# Patient Record
Sex: Male | Born: 1980 | Race: Black or African American | Hispanic: No | Marital: Single | Smoking: Current every day smoker
Health system: Southern US, Community
[De-identification: ages and names within clinical notes are randomized; demographics above are authoritative.]

## PROBLEM LIST (undated history)

## (undated) HISTORY — PX: OTHER SURGICAL HISTORY: SHX169

---

## 2018-03-27 ENCOUNTER — Other Ambulatory Visit: Payer: Self-pay

## 2018-03-27 ENCOUNTER — Encounter (HOSPITAL_BASED_OUTPATIENT_CLINIC_OR_DEPARTMENT_OTHER): Payer: Self-pay

## 2018-03-27 ENCOUNTER — Emergency Department (HOSPITAL_BASED_OUTPATIENT_CLINIC_OR_DEPARTMENT_OTHER)
Admission: EM | Admit: 2018-03-27 | Discharge: 2018-03-27 | Disposition: A | Discharge status: Home / Self Care | Payer: Self-pay | Attending: Emergency Medicine | Admitting: Emergency Medicine

## 2018-03-27 ENCOUNTER — Emergency Department (HOSPITAL_BASED_OUTPATIENT_CLINIC_OR_DEPARTMENT_OTHER): Payer: Self-pay

## 2018-03-27 DIAGNOSIS — M79645 Pain in left finger(s): Secondary | ICD-10-CM | POA: Insufficient documentation

## 2018-03-27 DIAGNOSIS — F17200 Nicotine dependence, unspecified, uncomplicated: Secondary | ICD-10-CM | POA: Insufficient documentation

## 2018-03-27 MED ORDER — CEPHALEXIN 250 MG PO CAPS
500.0000 mg | ORAL_CAPSULE | Freq: Once | ORAL | Status: AC
  Administered 2018-03-27: 500 mg via ORAL
  Filled 2018-03-27: qty 2

## 2018-03-27 MED ORDER — HYDROCODONE-ACETAMINOPHEN 5-325 MG PO TABS
2.0000 | ORAL_TABLET | Freq: Once | ORAL | Status: AC
  Administered 2018-03-27: 2 via ORAL
  Filled 2018-03-27: qty 2

## 2018-03-27 MED ORDER — CEPHALEXIN 500 MG PO CAPS: 500.0000 mg | ORAL_CAPSULE | Freq: Four times a day (QID) | ORAL | 0 refills | Status: AC

## 2018-03-27 MED ORDER — HYDROCODONE-ACETAMINOPHEN 5-325 MG PO TABS: 2.0000 | ORAL_TABLET | ORAL | 0 refills | Status: AC | PRN

## 2018-03-27 NOTE — Discharge Instructions (Signed)
Your evaluated in the emergency department for right thumb pain.  You had an x-ray that did not show an obvious fracture.  We have concerns that there may be an infection in that area and are prescribing you antibiotics.  If this seems to be getting worse it is possible that you may need that area drained.  We are giving you the number for a hand surgeon if you have worsening symptoms.

## 2018-03-27 NOTE — ED Provider Notes (Signed)
[facility] EMERGENCY DEPARTMENT Provider Note   CSN: [id number] Arrival date & time: 03/27/18  1847     History   Chief Complaint Chief Complaint  Patient presents with  . Hand Pain    HPI Oscar Byrd is a 37 y.o. male.  He is right-hand dominant.  He works as a Glass blower/designerpacker.  He is complaining of left thumb pain that is been going on for 5 days.  He denies any trauma but he says he always is being in his hands.  The pain is primarily at the distal part of the thumb at the pad but he states it radiates throughout the thumb into the hand and up to the wrist.  He states it severe when there is any kind of pressure on the pad.  He is denied any fever or other illness symptoms.  He has been taking ibuprofen with minimal relief.  He was unable to work today secondary to the pain.  The history is provided by the patient.  Hand Pain  This is a new problem. The current episode started more than 2 days ago. The problem occurs constantly. The problem has not changed since onset.Pertinent negatives include no chest pain, no abdominal pain, no headaches and no shortness of breath. Exacerbated by: pressure. The symptoms are relieved by rest. He has tried rest for the symptoms. The treatment provided no relief.    History reviewed. No pertinent past medical history.  There are no active problems to display for this patient.   Past Surgical History:  Procedure Laterality Date  . arm surgery          Home Medications    Prior to Admission medications   Not on File    Family History No family history on file.  Social History Social History   Tobacco Use  . Smoking status: Current Every Day Smoker  . Smokeless tobacco: Never Used  Substance Use Topics  . Alcohol use: Yes    Comment: daily  . Drug use: Yes    Types: Marijuana     Allergies   Patient has no known allergies.   Review of Systems Review of Systems  Constitutional: Negative for fever.  HENT: Negative  for sore throat.   Eyes: Negative for visual disturbance.  Respiratory: Negative for shortness of breath.   Cardiovascular: Negative for chest pain.  Gastrointestinal: Negative for abdominal pain.  Genitourinary: Negative for dysuria.  Musculoskeletal: Negative for back pain and neck pain.  Skin: Negative for rash.  Neurological: Negative for headaches.     Physical Exam Updated Vital Signs BP (!) 182/122 (BP Location: Left Arm)   Pulse 70   Temp 98.1 F (36.7 C) (Oral)   Resp 18   Ht 5\' 11"  (1.803 m)   Wt 89.8 kg (198 lb)   SpO2 100%   BMI 27.62 kg/m   Physical Exam  Constitutional: He appears well-developed and well-nourished.  HENT:  Head: Normocephalic and atraumatic.  Eyes: Conjunctivae are normal.  Neck: Neck supple.  Pulmonary/Chest: Effort normal.  Musculoskeletal: Normal range of motion.  Patient's left shoulder elbow and wrist are full range of motion without difficulty.  There is no particular pain in any of his other digits except for his thumb.  His MCP and IP are nontender.  His pad of left thumb is tender and slightly swollen.  There is no fluctuance to it and he has a lot of callus over his thumb.  The nail is intact and there  is normal cap refill.  He has intact sensation.  Neurological: He is alert. He has normal strength. No sensory deficit. GCS eye subscore is 4. GCS verbal subscore is 5. GCS motor subscore is 6.  Skin: Skin is warm and dry. Capillary refill takes less than 2 seconds.  Psychiatric: He has a normal mood and affect.  Nursing note and vitals reviewed.    ED Treatments / Results  Labs (all labs ordered are listed, but only abnormal results are displayed) Labs Reviewed - No data to display  EKG None  Radiology Dg Finger Thumb Left  Result Date: 03/27/2018 CLINICAL DATA:  Left thumb pain. EXAM: LEFT THUMB 2+V COMPARISON:  None. FINDINGS: No fracture. No subluxation or dislocation. Degenerative changes are noted in the MCP joint.  IMPRESSION: Degenerative changes without acute bony abnormality. Electronically Signed   By: Kennith Center M.D.   On: 03/27/2018 19:51    Procedures Procedures (including critical care time)  Medications Ordered in ED Medications  HYDROcodone-acetaminophen (NORCO/VICODIN) 5-325 MG per tablet 2 tablet (has no administration in time range)  cephALEXin (KEFLEX) capsule 500 mg (has no administration in time range)     Initial Impression / Assessment and Plan / ED Course  I have reviewed the triage vital signs and the nursing notes.  Pertinent labs & imaging results that were available during my care of the patient were reviewed by me and considered in my medical decision making (see chart for details).  Clinical Course as of Mar 28 1712  Wed Mar 27, 2018  7043 37 year old right-hand-dominant male with left thumb pain.  He does not have a known history of trauma.  He seems most tender in his distal phalanx and primarily on the pad.  Checking an x-ray to make sure it is not a fracture but I am concerned this may be a felon.  It does not look swollen enough that it needs to be I&D currently in if the x-rays are negative we will put him on some antibiotics with the understanding that he may need to follow-up with Hand consultant   [MB]  [id number] Reviewed patient in the PMP.   [MB]    Clinical Course User Index [MB] Terrilee Files, MD     Final Clinical Impressions(s) / ED Diagnoses   Final diagnoses:  Pain of left thumb    ED Discharge Orders        Ordered    cephALEXin (KEFLEX) 500 MG capsule  4 times daily     03/27/18 1917    HYDROcodone-acetaminophen (NORCO/VICODIN) 5-325 MG tablet  Every 4 hours PRN     03/27/18 1917       Terrilee Files, MD 03/28/18 1714

## 2018-03-27 NOTE — ED Notes (Signed)
Pt verbalizes understanding of d/c instructions and denies any further needs at this time. 

## 2018-03-27 NOTE — ED Triage Notes (Signed)
Pt c/o pain to left hand x 5 days-no known injury-NAD-steady gait

## 2018-04-02 ENCOUNTER — Encounter (HOSPITAL_BASED_OUTPATIENT_CLINIC_OR_DEPARTMENT_OTHER): Payer: Self-pay

## 2018-04-02 ENCOUNTER — Emergency Department (HOSPITAL_BASED_OUTPATIENT_CLINIC_OR_DEPARTMENT_OTHER)
Admission: EM | Admit: 2018-04-02 | Discharge: 2018-04-02 | Disposition: A | Discharge status: Home / Self Care | Payer: Self-pay | Attending: Emergency Medicine | Admitting: Emergency Medicine

## 2018-04-02 ENCOUNTER — Emergency Department (HOSPITAL_BASED_OUTPATIENT_CLINIC_OR_DEPARTMENT_OTHER): Payer: Self-pay

## 2018-04-02 ENCOUNTER — Other Ambulatory Visit: Payer: Self-pay

## 2018-04-02 DIAGNOSIS — L03012 Cellulitis of left finger: Secondary | ICD-10-CM | POA: Insufficient documentation

## 2018-04-02 DIAGNOSIS — F17200 Nicotine dependence, unspecified, uncomplicated: Secondary | ICD-10-CM | POA: Insufficient documentation

## 2018-04-02 MED ORDER — LIDOCAINE HCL (PF) 1 % IJ SOLN
5.0000 mL | Freq: Once | INTRAMUSCULAR | Status: AC
  Administered 2018-04-02: 5 mL

## 2018-04-02 MED ORDER — DOXYCYCLINE HYCLATE 100 MG PO CAPS: 100.0000 mg | ORAL_CAPSULE | Freq: Two times a day (BID) | ORAL | 0 refills | Status: AC

## 2018-04-02 NOTE — ED Provider Notes (Signed)
[facility] EMERGENCY DEPARTMENT Provider Note   CSN: [id number] Arrival date & time: 04/02/18  1839     History   Chief Complaint Chief Complaint  Patient presents with  . Hand Pain    HPI Oscar Byrd is a 36 y.o. male presenting for 2 weeks of left thumb pain.  Patient describes pain as sharp, worse with palpation and 7/10 in severity.  Patient was seen here in the emergency department 1 week ago, at that time there was no sign of abscess to drain and patient was placed on Keflex and given return precautions.  Patient states that his pain has worsened since discharge and that his thumb has continued to swell, he states pain now radiates up his left hand to his left wrist.  Patient denies trauma or wound to the thumb.  He denies history of chronic diseases.  He states he is otherwise healthy.  Patient is right-hand dominant.  HPI  History reviewed. No pertinent past medical history.  There are no active problems to display for this patient.   Past Surgical History:  Procedure Laterality Date  . arm surgery          Home Medications    Prior to Admission medications   Medication Sig Start Date End Date Taking? Authorizing Provider  cephALEXin (KEFLEX) 500 MG capsule Take 1 capsule (500 mg total) by mouth 4 (four) times daily. 03/27/18   Terrilee Files, MD  doxycycline (VIBRAMYCIN) 100 MG capsule Take 1 capsule (100 mg total) by mouth 2 (two) times daily for 7 days. 04/02/18 04/09/18  Harlene Salts A, PA-C  HYDROcodone-acetaminophen (NORCO/VICODIN) 5-325 MG tablet Take 2 tablets by mouth every 4 (four) hours as needed for severe pain. 03/27/18   Terrilee Files, MD    Family History No family history on file.  Social History Social History   Tobacco Use  . Smoking status: Current Every Day Smoker  . Smokeless tobacco: Never Used  Substance Use Topics  . Alcohol use: Yes    Comment: daily  . Drug use: Yes    Types: Marijuana     Allergies     Patient has no known allergies.   Review of Systems Review of Systems  Constitutional: Negative.  Negative for chills and fever.  HENT: Negative.  Negative for rhinorrhea and sore throat.   Eyes: Negative.  Negative for visual disturbance.  Respiratory: Negative.  Negative for cough and shortness of breath.   Cardiovascular: Negative.  Negative for chest pain.  Gastrointestinal: Negative.  Negative for abdominal pain, blood in stool, diarrhea, nausea and vomiting.  Genitourinary: Negative.  Negative for dysuria and hematuria.  Musculoskeletal: Positive for arthralgias. Negative for myalgias.  Skin: Positive for color change. Negative for rash.  Neurological: Negative.  Negative for dizziness, weakness, numbness and headaches.     Physical Exam Updated Vital Signs BP 134/87 (BP Location: Left Arm)   Pulse 76   Temp 98.2 F (36.8 C) (Oral)   Resp 16   Ht 6' (1.829 m)   Wt 85.7 kg (189 lb)   SpO2 99%   BMI 25.63 kg/m   Physical Exam  Constitutional: He is oriented to person, place, and time. He appears well-developed and well-nourished. No distress.  HENT:  Head: Normocephalic and atraumatic.  Right Ear: External ear normal.  Left Ear: External ear normal.  Nose: Nose normal.  Eyes: Pupils are equal, round, and reactive to light. EOM are normal.  Neck: Trachea normal and normal range  of motion. No tracheal deviation present.  Pulmonary/Chest: Effort normal. No respiratory distress.  Abdominal: Soft. There is no tenderness. There is no rebound and no guarding.  Musculoskeletal: Normal range of motion.       Left hand: He exhibits tenderness and swelling. Normal sensation noted. Normal strength noted. He exhibits no thumb/finger opposition.       Hands: Neurological: He is alert and oriented to person, place, and time. No sensory deficit.  Skin: Skin is warm and dry.  Psychiatric: He has a normal mood and affect. His behavior is normal.     ED Treatments / Results   Labs (all labs ordered are listed, but only abnormal results are displayed) Labs Reviewed - No data to display  EKG None  Radiology Dg Finger Thumb Left  Result Date: 04/02/2018 CLINICAL DATA:  Acute left thumb pain without known injury. EXAM: LEFT THUMB 2+V COMPARISON:  Radiographs of March 27, 2018. FINDINGS: There is no evidence of fracture or dislocation. Possible degenerative changes seen involving the first metacarpophalangeal joint. Soft tissues are unremarkable IMPRESSION: Possible degenerative changes seen involving first metacarpophalangeal joint. No acute abnormality seen in the left thumb. Electronically Signed   By: Lupita Raider, M.D.   On: 04/02/2018 19:39    Procedures .Marland KitchenIncision and Drainage Date/Time: 04/02/2018 8:54 PM Performed by: Bill Salinas, PA-C Authorized by: Bill Salinas, PA-C   Consent:    Consent obtained:  Verbal   Consent given by:  Patient   Risks discussed:  Bleeding, incomplete drainage, pain, infection and damage to other organs Location:    Type:  Abscess   Size:  1cm x 1cm   Location:  Upper extremity   Upper extremity location:  Finger   Finger location:  L thumb Pre-procedure details:    Skin preparation:  Betadine Anesthesia (see MAR for exact dosages):    Anesthesia method:  Nerve block   Block needle gauge:  27 G   Block anesthetic:  Lidocaine 1% w/o epi   Block technique:  Digital   Block injection procedure:  Anatomic landmarks identified, introduced needle, incremental injection, negative aspiration for blood and anatomic landmarks palpated   Block outcome:  Anesthesia achieved Procedure type:    Complexity:  Simple Procedure details:    Incision types:  Single straight   Incision depth:  Subcutaneous   Scalpel blade:  11   Wound management:  Probed and deloculated   Drainage:  Purulent and bloody   Drainage amount:  Moderate   Wound treatment:  Wound left open   Packing materials:  None Post-procedure details:     Patient tolerance of procedure:  Tolerated well, no immediate complications Comments:     Incision approximately 1 cm long to left thumb pad.  Covered with sterile gauze and antibiotic ointment by nursing staff.   (including critical care time)  Medications Ordered in ED Medications  lidocaine (PF) (XYLOCAINE) 1 % injection 5 mL (5 mLs Infiltration Given by Other 04/02/18 2023)     Initial Impression / Assessment and Plan / ED Course  I have reviewed the triage vital signs and the nursing notes.  Pertinent labs & imaging results that were available during my care of the patient were reviewed by me and considered in my medical decision making (see chart for details).  Clinical Course as of Apr 02 2336  Tue Apr 02, 2018  2216 I have spoken to hand Ortho, Dr. Merlyn Lot, who recommends switching the patient to doxycycline and following  up with his office for an appointment this week.   [BM]    Clinical Course User Index [BM] Bill SalinasMorelli, Devonta Blanford A, PA-C   Patient presenting with left thumb pain and swelling for 2 weeks.  On physical examination there is an obvious felon to the left thumb.  This was incised and drained as documented above in the procedure note.  I have spoken to hand orthopedic surgeon Dr. Merlyn LotKuzma who recommends switching the patient to doxycycline for MRSA coverage.  Dr. Merlyn LotKuzma also recommends a follow-up with his office this week.  After procedure patient is resting comfortably in room states that he is no longer in pain after the procedure.  Wound dressed by nursing staff.  Patient prescribed doxycycline 100 mg twice daily for 7 days and given referral to Dr. Merrilee SeashoreKuzma's office.  At this time there does not appear to be any evidence of an acute emergency medical condition and the patient appears stable for discharge with appropriate outpatient follow up. Diagnosis was discussed with patient who verbalizes understanding of care plan and is agreeable to discharge. I have discussed return  precautions with patient and family who verbalize understanding of return precautions. Patient strongly encouraged to follow-up with their PCP. All questions answered.  Patient's case discussed with Dr. Silverio LayYao who agrees with plan to discharge with follow-up.     Note: Portions of this report may have been transcribed using voice recognition software. Every effort was made to ensure accuracy; however, inadvertent computerized transcription errors may still be present.   Final Clinical Impressions(s) / ED Diagnoses   Final diagnoses:  Felon of finger of left hand    ED Discharge Orders        Ordered    doxycycline (VIBRAMYCIN) 100 MG capsule  2 times daily     04/02/18 2224       Elizabeth PalauMorelli, Dhrithi Riche A, PA-C 04/02/18 2339    Charlynne PanderYao, David Hsienta, MD 04/03/18 [phone]

## 2018-04-02 NOTE — ED Notes (Signed)
Called Hand surgery for consult (Dr. Merlyn LotKuzma).

## 2018-04-02 NOTE — Discharge Instructions (Addendum)
Please take the new antibiotic medication, doxycycline, as prescribed. Please call the hand surgeon Dr. Merrilee Seashore office first thing tomorrow morning to schedule a follow-up appointment. Please also follow-up with your primary care provider regarding your visit today.  I have attached resources below if you need help finding a primary care provider.  Your blood pressure was elevated today, please be sure to follow-up with your primary doctor regarding this as well. Return to the emergency department for any new or worsening symptoms.  If for any reason you are unable to follow-up with Dr. Merrilee Seashore office in the next 2 days please return to the emergency department for wound check.  Contact a health care provider if: Your pain medicine is not helping. You have more redness, swelling, or pain at your fingertip. You continue to have fluid, blood, or pus coming from your fingertip. Your infection area feels warm to the touch. You continue to notice a bad smell coming from your fingertip or your dressing. Get help right away if: The area of redness is spreading, or you notice a red streak going away from your fingertip. You have a fever.  RESOURCE GUIDE  Chronic Pain Problems: Contact [facility]  [phone] Patients need to be referred by their primary care doctor.  Insufficient Money for Medicine: Contact United Way:  call "211" or [facility] [phone].  No Primary Care Doctor: Call [facility] Connect  [phone] - can help you locate a primary care doctor that  accepts your insurance, provides certain services, etc. Physician Referral Service- [phone]  Agencies that provide inexpensive medical care: [facility]  [phone] [facility]  [phone] [facility]  [phone] [facility]  [phone] Planned Parenthood  [phone] [facility]  [phone]  Medicaid-accepting [facility] Providers: [facility]- [address]  [phone], Mon-Fri 9am-7pm, Sat 9am-1pm [facility]- [address] [place], [address] [place]  [phone] [facility]- [address], [address] [place]  [phone] [facility]- [address]  [phone] Renaye Rakers- [address] [place], [address], [phone]  Only accepts [place] patients after they have their name  applied to their card  Self Pay (no insurance) in [facility]: Sickle Cell Patients: Dr Willey Blade, [facility] Internal Medicine  [address] [place], [phone] [facility]- [address] [place]  [phone]       [facility] [place]- [address]       -     [facility]- see information above (Speak to [employer] if you do not have insurance)       -  [facility]- [address] [place], [phone]       -  [facility]- [address],  [phone]       -  [facility]- [address], [phone]       -  Dr Julio Sicks-  [address], [address], [place], [phone]       -  [facility]- [address] [phone]       -  [facility]- [address], [phone], also [address], [phone]       -    [facility]- [address] [place], [phone], 1st & 3rd  Saturday   every month, 10am-1pm  1) Find a Doctor and Pay Out of Pocket Although you won't have to find out who is covered by your insurance plan, it is a good idea to ask around and get recommendations. You will then need to call the office and see if the doctor you have chosen will accept you as a new patient and what types of options they offer for patients who are self-pay. Some doctors offer discounts or will set up payment plans for their patients who do not have insurance, but you will need to ask so you aren't surprised when you get to your appointment.  2) Contact Your Local Health  Department Not all health departments have doctors that can see patients for sick visits, but many do, so it is worth a call to see if yours does. If you don't know where your local health department is, you can check in your phone book. The CDC also has a tool to help you locate your state's health department, and many state websites also have listings of all of their local health departments.  3) Find a Walk-in Clinic If your illness is not likely to be very severe or complicated, you may want to try a walk in clinic. These are popping up all over the country in pharmacies, drugstores, and shopping centers. They're usually staffed by nurse practitioners or physician assistants that have been trained to treat common illnesses and complaints. They're usually fairly quick and inexpensive. However, if you have serious medical issues or chronic medical problems, these are probably not your best option  STD Testing [facility] of [facility] [place], STD Clinic, [address], [place], phone [phone] or [phone].  Monday - Friday, call for an appointment. [facility] of [facility] [place], STD Clinic, [place], [place], phone [phone] or [phone].  Monday - Friday, call for an appointment.  Abuse/Neglect: [facility] Child Abuse Hotline 786-145-5638(336) (770) 197-7928 [facility] Child Abuse Hotline 843-637-42792177161927 (After Hours)  Emergency Shelter:  [facility] 551-537-0691(336) (787) [phone]  Maternity Homes: Room at the [place] [facility] [phone](336) [phone] [facility] [phone]  MRSA Hotline #:   938 343 4754204 398 3345  [facility] Resources  [facility]  United Way [facility]. [address].                 [address]         [address]  [place]                                               [place]                              [place] Phone:  [phone]                                   Phone:  [phone]                   Phone:  [phone]  [facility], [phone] [facility] - [facility]- [phone]       -     [facility] [place], [address],                                  [phone], Insurance  [place] Child Abuse Hotline (772)564-9832 or 737-374-7446 (After Hours)   Behavioral Health Services  Substance Abuse Resources: [facility] and Drug Services  (418)500-1003 [facility] (409)[phone] [facility] [place] [phone] Floydene Flock [phone] Residential & Outpatient Substance Abuse Program  [phone]  Psychological Services: [facility]  [phone] [facility] Services  [phone] [facility], [phone] [place]. [address], [place], ACCESS LINE: (629) 199-1659 or (815)212-3077, Ent[employer]ur[employer]an.co.za  Dental Assistance  If unable to pay or uninsured, contact:  [facility] or [facility]. to become qualified for the adult dental clinic.  Patients with Medicaid: [facility] [phone] [address], [phone] [address], [phone]  If unable to pay, or uninsured, contact [facility] [phone]) or [facility] Department [phone] in [place], [phone] in [facility]) to become qualified for the adult dental clinic   Other Low-Cost Community Dental Services: [facility]- [address] [place], [place], [place], [address], [phone], Ext. [phone], 2nd and 4th Thursday of the month at 6:30am.  10 clients each day by appointment, can sometimes see walk-in patients if someone does not show for an appointment. [facility]- [address] [place], [place], [address], [phone] [facility] [address] [place], [place], [address], [phone] [facility] Health Department- [phone] [facility] Health  Department- [phone] [facility] [phone]

## 2018-04-02 NOTE — ED Notes (Signed)
Patient transported to X-ray 

## 2018-04-02 NOTE — ED Notes (Signed)
Called Hand Surgeon (Dr. Merlyn LotKuzma).

## 2018-04-02 NOTE — ED Triage Notes (Signed)
Pt states he was advised to return prn for pain to left thumb-NAD-steady gait

## 2018-05-21 ENCOUNTER — Emergency Department (HOSPITAL_BASED_OUTPATIENT_CLINIC_OR_DEPARTMENT_OTHER)
Admission: EM | Admit: 2018-05-21 | Discharge: 2018-05-21 | Disposition: A | Discharge status: Home / Self Care | Payer: Self-pay | Attending: Emergency Medicine | Admitting: Emergency Medicine

## 2018-05-21 ENCOUNTER — Encounter (HOSPITAL_BASED_OUTPATIENT_CLINIC_OR_DEPARTMENT_OTHER): Payer: Self-pay

## 2018-05-21 ENCOUNTER — Other Ambulatory Visit: Payer: Self-pay

## 2018-05-21 DIAGNOSIS — F17210 Nicotine dependence, cigarettes, uncomplicated: Secondary | ICD-10-CM | POA: Insufficient documentation

## 2018-05-21 DIAGNOSIS — Y939 Activity, unspecified: Secondary | ICD-10-CM | POA: Insufficient documentation

## 2018-05-21 DIAGNOSIS — T23261A Burn of second degree of back of right hand, initial encounter: Secondary | ICD-10-CM | POA: Insufficient documentation

## 2018-05-21 DIAGNOSIS — Y990 Civilian activity done for income or pay: Secondary | ICD-10-CM | POA: Insufficient documentation

## 2018-05-21 DIAGNOSIS — Y929 Unspecified place or not applicable: Secondary | ICD-10-CM | POA: Insufficient documentation

## 2018-05-21 DIAGNOSIS — T23209A Burn of second degree of unspecified hand, unspecified site, initial encounter: Secondary | ICD-10-CM

## 2018-05-21 DIAGNOSIS — X16XXXA Contact with hot heating appliances, radiators and pipes, initial encounter: Secondary | ICD-10-CM | POA: Insufficient documentation

## 2018-05-21 NOTE — Discharge Instructions (Addendum)
Apply a thin layer of antibiotic ointment, such as bacitracin or neosporin daily and cover with a clean bandage.  Keep your burn clean with soap and water daily. You can apply a cool wash cloth for pain relief. Take 600mg  ibuprofen/advil ever 6 hours for pain. Return to the ER for signs of infection, including pus draining from wound, worsening pain.

## 2018-05-21 NOTE — ED Provider Notes (Signed)
[facility] EMERGENCY DEPARTMENT Provider Note   CSN: [id number] Arrival date & time: 05/21/18  2103     History   Chief Complaint Chief Complaint  Patient presents with  . Hand Burn    HPI Oscar Byrd is a 37 y.o. male presenting to the ED with complaint of burn to right hand that occurred yesterday while at work. He states he was burned by hot steam from a radiator. He has had some pain, worse with palpation. Reports blisters to right hand on thumb. No medications tried PTA. 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  The history is provided by the patient.    History reviewed. No pertinent past medical history.  There are no active problems to display for this patient.   Past Surgical History:  Procedure Laterality Date  . arm surgery          Home Medications    Prior to Admission medications   Medication Sig Start Date End Date Taking? Authorizing Provider  cephALEXin (KEFLEX) 500 MG capsule Take 1 capsule (500 mg total) by mouth 4 (four) times daily. 03/27/18   Terrilee Files, MD  HYDROcodone-acetaminophen (NORCO/VICODIN) 5-325 MG tablet Take 2 tablets by mouth every 4 (four) hours as needed for severe pain. 03/27/18   Terrilee Files, MD    Family History No family history on file.  Social History Social History   Tobacco Use  . Smoking status: Current Every Day Smoker  . Smokeless tobacco: Never Used  Substance Use Topics  . Alcohol use: Yes    Comment: daily  . Drug use: Yes    Types: Marijuana     Allergies   Patient has no known allergies.   Review of Systems Review of Systems  Skin: Positive for color change and wound.  All other systems reviewed and are negative.    Physical Exam Updated Vital Signs BP (!) 144/90 (BP Location: Left Arm)   Pulse 99   Temp 98.1 F (36.7 C) (Oral)   Resp 18   Ht 6' (1.829 m)   Wt 85.7 kg   SpO2 100%   BMI 25.63 kg/m   Physical Exam  Constitutional: He appears  well-developed and well-nourished. No distress.  HENT:  Head: Normocephalic and atraumatic.  Eyes: Conjunctivae are normal.  Cardiovascular: Normal rate and intact distal pulses.  Pulmonary/Chest: Effort normal.  Skin:  Right hand with blisters and mild erythema present. Blister are intact and blanchable. 1 blister is nickel-sized and located on dorsal hand bw 1st and 2nd metacarpals. The other is on dorsal hand at base of thumb. There are multiple very small blisters over interphalangeal joint.   Psychiatric: He has a normal mood and affect. His behavior is normal.  Nursing note and vitals reviewed.    ED Treatments / Results  Labs (all labs ordered are listed, but only abnormal results are displayed) Labs Reviewed - No data to display  EKG None  Radiology No results found.  Procedures Procedures (including critical care time)  Medications Ordered in ED Medications - No data to display   Initial Impression / Assessment and Plan / ED Course  I have reviewed the triage vital signs and the nursing notes.  Pertinent labs & imaging results that were available during my care of the patient were reviewed by me and considered in my medical decision making (see chart for details).     Pt with superficial partial thickness burn to a small portion of the dorsum  of his right hand. NV intact. No signs of infection. No hx of immunocompromise. Discussed proper burn care and symptomatic management. Work note provided. Safe for discharge.  Final Clinical Impressions(s) / ED Diagnoses   Final diagnoses:  Superficial partial thickness burn of hand    ED Discharge Orders    None       Robinson, [place] N, PA-C 05/21/18 2309    Arby BarrettePfeiffer, Marcy, MD 05/22/18 2147

## 2018-05-21 NOTE — ED Triage Notes (Signed)
Two blisters from hot steam from a radiator yesterday, blisters are intact, on right thumb and index finger

## 2019-04-18 IMAGING — DX DG FINGER THUMB 2+V*L*
3 series · 3 of 3 positions shown · non-contrast
Comparison: None.

CLINICAL DATA: Left thumb pain.

EXAM:
LEFT THUMB 2+V

[finger ap]
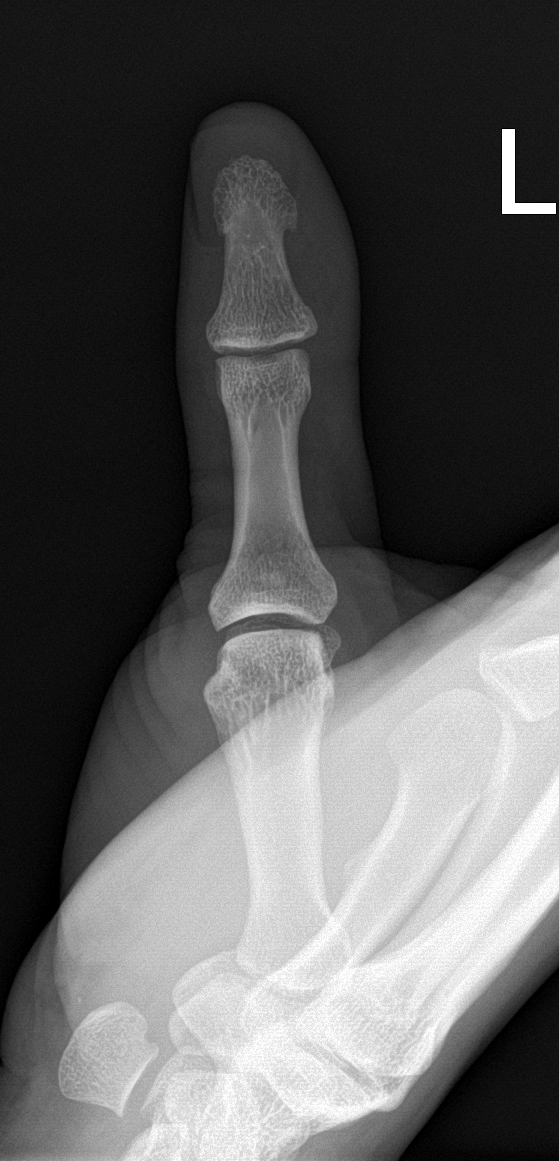

[finger lat]
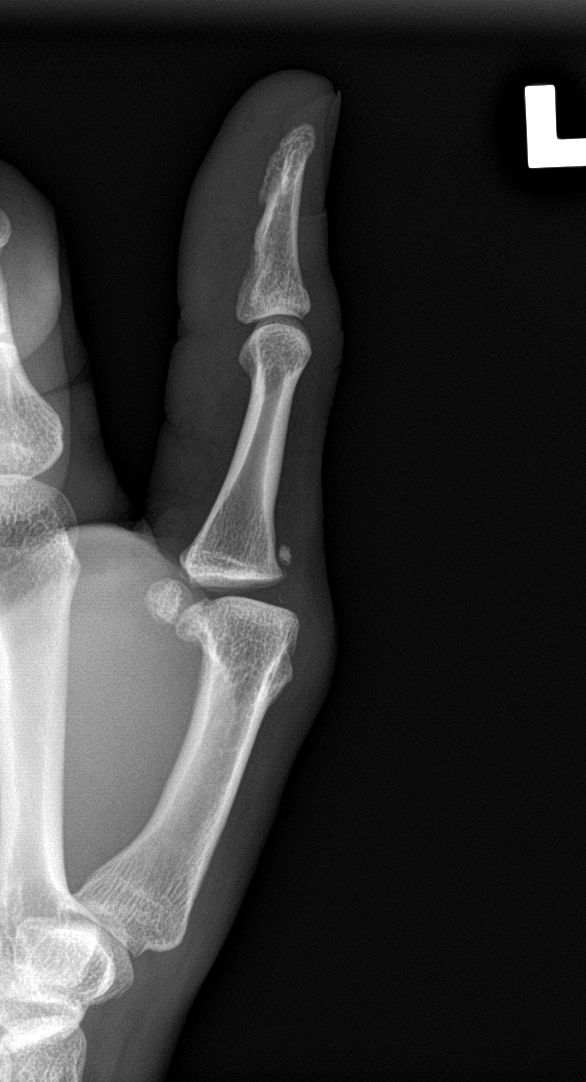

[finger obl]
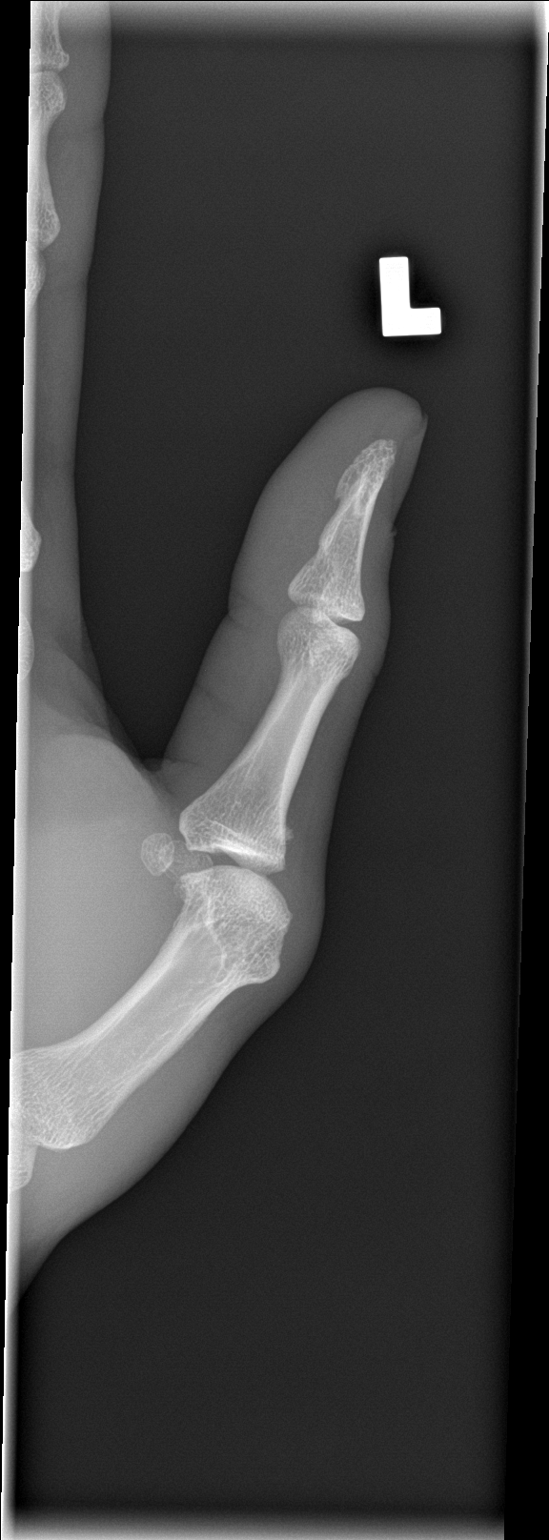

[3 of 3 positions shown; findings below may reference images not displayed]

FINDINGS: No fracture. No subluxation or dislocation. Degenerative changes are
noted in the MCP joint.
IMPRESSION: Degenerative changes without acute bony abnormality.

## 2021-05-17 ENCOUNTER — Other Ambulatory Visit: Payer: Self-pay

## 2021-05-17 ENCOUNTER — Emergency Department (HOSPITAL_COMMUNITY)
Admission: EM | Admit: 2021-05-17 | Discharge: 2021-05-17 | Disposition: A | Discharge status: Home / Self Care | Payer: Self-pay | Attending: Emergency Medicine | Admitting: Emergency Medicine

## 2021-05-17 ENCOUNTER — Encounter (HOSPITAL_COMMUNITY): Payer: Self-pay | Admitting: Emergency Medicine

## 2021-05-17 DIAGNOSIS — Z28310 Unvaccinated for covid-19: Secondary | ICD-10-CM | POA: Insufficient documentation

## 2021-05-17 DIAGNOSIS — U071 COVID-19: Secondary | ICD-10-CM | POA: Insufficient documentation

## 2021-05-17 DIAGNOSIS — Z20822 Contact with and (suspected) exposure to covid-19: Secondary | ICD-10-CM

## 2021-05-17 DIAGNOSIS — F17210 Nicotine dependence, cigarettes, uncomplicated: Secondary | ICD-10-CM | POA: Insufficient documentation

## 2021-05-17 LAB — SARS CORONAVIRUS 2 (TAT 6-24 HRS): SARS Coronavirus 2: POSITIVE — AB

## 2021-05-17 NOTE — ED Provider Notes (Signed)
[facility]-EMERGENCY DEPT Provider Note   CSN: [id number] Arrival date & time: 05/17/21  1121     History Chief Complaint  Patient presents with   Chills   Covid Positive    Oscar Byrd is a 40 y.o. male who presents today for positive COVID-19 home test.  He states that he first developed symptoms on Friday the 16th.  He reports occasional cough and fatigue.  He denies any fevers.  He denies shortness of breath, chest pain, leg swelling or rash.  He states that he needs a official test and a work note.  He is not vaccinated.  HPI     History reviewed. No pertinent past medical history.  There are no problems to display for this patient.   Past Surgical History:  Procedure Laterality Date   arm surgery         History reviewed. No pertinent family history.  Social History   Tobacco Use   Smoking status: Every Day   Smokeless tobacco: Never  Substance Use Topics   Alcohol use: Yes    Comment: daily   Drug use: Yes    Types: Marijuana    Home Medications Prior to Admission medications   Medication Sig Start Date End Date Taking? Authorizing Provider  cephALEXin (KEFLEX) 500 MG capsule Take 1 capsule (500 mg total) by mouth 4 (four) times daily. 03/27/18   Terrilee Files, MD  HYDROcodone-acetaminophen (NORCO/VICODIN) 5-325 MG tablet Take 2 tablets by mouth every 4 (four) hours as needed for severe pain. 03/27/18   Terrilee Files, MD    Allergies    Patient has no known allergies.  Review of Systems   Review of Systems  Constitutional:  Positive for chills and fatigue. Negative for fever.  Respiratory:  Positive for cough. Negative for shortness of breath.   Cardiovascular:  Negative for chest pain, palpitations and leg swelling.  Gastrointestinal:  Negative for abdominal pain.  Genitourinary:  Negative for dysuria.  Musculoskeletal:  Positive for myalgias.  Skin:  Negative for rash.  Neurological:  Negative for weakness.   Psychiatric/Behavioral:  Negative for confusion.   All other systems reviewed and are negative.  Physical Exam Updated Vital Signs BP (!) 128/91 (BP Location: Left Arm)   Pulse 61   Temp 97.9 F (36.6 C) (Oral)   Resp 18   Ht 6' (1.829 m)   Wt 90.4 kg   SpO2 100%   BMI 27.03 kg/m   Physical Exam Vitals and nursing note reviewed.  Constitutional:      General: He is not in acute distress.    Appearance: He is not ill-appearing.  HENT:     Head: Atraumatic.  Eyes:     Conjunctiva/sclera: Conjunctivae normal.  Cardiovascular:     Rate and Rhythm: Normal rate.  Pulmonary:     Effort: Pulmonary effort is normal. No respiratory distress.  Abdominal:     General: There is no distension.  Musculoskeletal:     Cervical back: Normal range of motion and neck supple.     Comments: No obvious acute injury  Skin:    General: Skin is warm.  Neurological:     Mental Status: He is alert.     Comments: Awake and alert, answers all questions appropriately.  Speech is not slurred.  Psychiatric:        Mood and Affect: Mood normal.        Behavior: Behavior normal.    ED Results / Procedures /  Treatments   Labs (all labs ordered are listed, but only abnormal results are displayed) Labs Reviewed  SARS CORONAVIRUS 2 (TAT 6-24 HRS) - Abnormal; Notable for the following components:      Result Value   SARS Coronavirus 2 POSITIVE (*)    All other components within normal limits    EKG None  Radiology No results found.  Procedures Procedures   Medications Ordered in ED Medications - No data to display  ED Course  I have reviewed the triage vital signs and the nursing notes.  Pertinent labs & imaging results that were available during my care of the patient were reviewed by me and considered in my medical decision making (see chart for details).    MDM Rules/Calculators/A&P                         Oscar Byrd was evaluated in Emergency Department on 05/17/2021 for the  symptoms described in the history of present illness. He was evaluated in the context of the global COVID-19 pandemic, which necessitated consideration that the patient might be at risk for infection with the SARS-CoV-2 virus that causes COVID-19. Institutional protocols and algorithms that pertain to the evaluation of patients at risk for COVID-19 are in a state of rapid change based on information released by regulatory bodies including the CDC and federal and state organizations. These policies and algorithms were followed during the patient's care in the ED.  Oscar Byrd is a 40 year old man who presents today for evaluation of covid 19 symptoms after a positive test at home.  He denies CP, shortness of breath, rash, or syncope.  He states he just needs an official test and a work note today.    Covid test ordered.    No indication for CXR at this time.  His symptoms have been improving since they started. He denies any significant medical history, doesn't clearly meet high risk criteria based on medical conditions for antiviral treatment.  Says he just wants a work note.   Return precautions were discussed with patient who states their understanding.  At the time of discharge patient denied any unaddressed complaints or concerns.  Patient is agreeable for discharge home.  Note: Portions of this report may have been transcribed using voice recognition software. Every effort was made to ensure accuracy; however, inadvertent computerized transcription errors may be present   Final Clinical Impression(s) / ED Diagnoses Final diagnoses:  Suspected COVID-19 virus infection    Rx / DC Orders ED Discharge Orders     None        Norman Clay 05/17/21 2215    Gloris Manchester, MD 05/17/21 2318

## 2021-05-17 NOTE — ED Triage Notes (Signed)
Pt states he took an at home covid test that was positive and for 2-3 days he has had hot, cold and loss of smell. Alert and oriented.

## 2021-05-17 NOTE — Discharge Instructions (Signed)
Please take Ibuprofen (Advil, motrin) and Tylenol (acetaminophen) to relieve your pain.   ? ?You may take up to 600 MG (3 pills) of normal strength ibuprofen every 8 hours as needed.   ?You make take tylenol, up to 1,000 mg (two extra strength pills) every 8 hours as needed.  ? ?It is safe to take ibuprofen and tylenol at the same time as they work differently.  ? Do not take more than 3,000 mg tylenol in a 24 hour period (not more than one dose every 8 hours.  Please check all medication labels as many medications such as pain and cold medications may contain tylenol.  Do not drink alcohol while taking these medications.  Do not take other NSAID'S while taking ibuprofen (such as aleve or naproxen).  Please take ibuprofen with food to decrease stomach upset. ? ?

## 2021-12-06 ENCOUNTER — Telehealth: Payer: Self-pay

## 2021-12-06 ENCOUNTER — Other Ambulatory Visit (HOSPITAL_COMMUNITY): Payer: Self-pay

## 2021-12-06 NOTE — Telephone Encounter (Signed)
[facility] Patient Advocate Encounter ? ?Insurance verification completed.   ? ?The patient is uninsured and will need patient assistance for medication. ? ?We can complete the application and will need to meet with the patient for signatures and income documentation. ? ?Adelis Docter, CPhT ?Specialty Pharmacy Patient Advocate ?[facility] ?Phone: [phone] ?Fax:  [phone]  ?

## 2021-12-07 ENCOUNTER — Ambulatory Visit (INDEPENDENT_AMBULATORY_CARE_PROVIDER_SITE_OTHER): Payer: Self-pay | Admitting: Pharmacist

## 2021-12-07 ENCOUNTER — Other Ambulatory Visit: Payer: Self-pay

## 2021-12-07 ENCOUNTER — Other Ambulatory Visit (HOSPITAL_COMMUNITY)
Admission: RE | Admit: 2021-12-07 | Discharge: 2021-12-07 | Disposition: A | Discharge status: Home / Self Care | Payer: Self-pay | Source: Ambulatory Visit | Attending: Infectious Disease | Admitting: Infectious Disease

## 2021-12-07 DIAGNOSIS — Z113 Encounter for screening for infections with a predominantly sexual mode of transmission: Secondary | ICD-10-CM | POA: Insufficient documentation

## 2021-12-07 DIAGNOSIS — Z1159 Encounter for screening for other viral diseases: Secondary | ICD-10-CM

## 2021-12-07 DIAGNOSIS — Z114 Encounter for screening for human immunodeficiency virus [HIV]: Secondary | ICD-10-CM

## 2021-12-07 NOTE — Progress Notes (Signed)
? ?  12/07/2021 ? ?HPI: Oscar Byrd is a 41 y.o. male who presents to the [facility] clinic today for STI testing. ? ?There are no problems to display for this patient. ? ? ?Patient's Medications  ?New Prescriptions  ? No medications on file  ?Previous Medications  ? CEPHALEXIN (KEFLEX) 500 MG CAPSULE    Take 1 capsule (500 mg total) by mouth 4 (four) times daily.  ? HYDROCODONE-ACETAMINOPHEN (NORCO/VICODIN) 5-325 MG TABLET    Take 2 tablets by mouth every 4 (four) hours as needed for severe pain.  ?Modified Medications  ? No medications on file  ?Discontinued Medications  ? No medications on file  ? ? ?Allergies: ?No Known Allergies ? ?Past Medical History: ?No past medical history on file. ? ?Social History: ?Social History  ? ?Socioeconomic History  ? Marital status: Single  ?  Spouse name: Not on file  ? Number of children: Not on file  ? Years of education: Not on file  ? Highest education level: Not on file  ?Occupational History  ? Not on file  ?Tobacco Use  ? Smoking status: Every Day  ? Smokeless tobacco: Never  ?Substance and Sexual Activity  ? Alcohol use: Yes  ?  Comment: daily  ? Drug use: Yes  ?  Types: Marijuana  ? Sexual activity: Not on file  ?Other Topics Concern  ? Not on file  ?Social History Narrative  ? Not on file  ? ?Social Determinants of Health  ? ?Financial Resource Strain: Not on file  ?Food Insecurity: Not on file  ?Transportation Needs: Not on file  ?Physical Activity: Not on file  ?Stress: Not on file  ?Social Connections: Not on file  ? ? ? ?Assessment: ?Oscar Byrd is here today for STI testing. His partner is present with him today as well and states that her numbers were high recently and she was told that her partner should be tested. When asked what he would like to be tested for, Oscar Byrd said he would like to be tested for everything. Oscar Byrd reports the last time he was tested for HIV and STIs was in 2004. We will test for HIV, Hepatitis, and STIs today.  ? ?Discussed the option of  starting PrEP for individuals with HIV positive partners, people with multiple partners, and people who do not reliably use condoms to help reduce the risk of HIV transmission; however, he would like to defer making a decision until his test results come back. Patient is uninsured and would require patient assistance if treatment is needed.  ? ?Plan: ?- STI screening: RPR, urine GC/CT swabs for cytology today ?- HIV antibody testing and Hepatitis A/B/C screening ?- F/u results to see if treatment needed ? ? ?Jerrilyn Cairo, PharmD ?PGY1 Pharmacy Resident ?12/07/2021 9:08 AM  ?

## 2021-12-08 LAB — HEPATITIS C ANTIBODY
Hepatitis C Ab: NONREACTIVE
SIGNAL TO CUT-OFF: 0.12 (ref <1.00)

## 2021-12-08 LAB — URINE CYTOLOGY ANCILLARY ONLY
Chlamydia: NEGATIVE
Comment: NEGATIVE
Comment: NORMAL
Neisseria Gonorrhea: NEGATIVE

## 2021-12-08 LAB — RPR: RPR Ser Ql: NONREACTIVE

## 2021-12-08 LAB — HIV ANTIBODY (ROUTINE TESTING W REFLEX): HIV 1&2 Ab, 4th Generation: NONREACTIVE

## 2021-12-08 LAB — HEPATITIS B SURFACE ANTIGEN: Hepatitis B Surface Ag: NONREACTIVE

## 2021-12-08 LAB — HEPATITIS A ANTIBODY, TOTAL: Hepatitis A AB,Total: REACTIVE — AB

## 2021-12-08 LAB — HEPATITIS B SURFACE ANTIBODY,QUALITATIVE: Hep B S Ab: NONREACTIVE
